# Patient Record
Sex: Male | Born: 1975 | Hispanic: No | Marital: Single | Smoking: Current some day smoker
Health system: Southern US, Community
[De-identification: ages and names within clinical notes are randomized; demographics above are authoritative.]

---

## 2014-01-18 ENCOUNTER — Encounter (HOSPITAL_COMMUNITY): Payer: Self-pay | Admitting: Emergency Medicine

## 2014-01-18 ENCOUNTER — Emergency Department (HOSPITAL_COMMUNITY): Payer: Self-pay

## 2014-01-18 ENCOUNTER — Emergency Department (HOSPITAL_COMMUNITY)
Admission: EM | Admit: 2014-01-18 | Discharge: 2014-01-19 | Disposition: A | Discharge status: Home / Self Care | Payer: Self-pay | Attending: Emergency Medicine | Admitting: Emergency Medicine

## 2014-01-18 DIAGNOSIS — T1490XA Injury, unspecified, initial encounter: Secondary | ICD-10-CM

## 2014-01-18 DIAGNOSIS — Z23 Encounter for immunization: Secondary | ICD-10-CM | POA: Insufficient documentation

## 2014-01-18 DIAGNOSIS — S1192XA Laceration with foreign body of unspecified part of neck, initial encounter: Secondary | ICD-10-CM | POA: Insufficient documentation

## 2014-01-18 DIAGNOSIS — T07XXXA Unspecified multiple injuries, initial encounter: Secondary | ICD-10-CM

## 2014-01-18 DIAGNOSIS — S51811A Laceration without foreign body of right forearm, initial encounter: Secondary | ICD-10-CM | POA: Insufficient documentation

## 2014-01-18 DIAGNOSIS — S41011A Laceration without foreign body of right shoulder, initial encounter: Secondary | ICD-10-CM | POA: Insufficient documentation

## 2014-01-18 LAB — CBC
HCT: 46.1 % (ref 39.0–52.0)
HEMOGLOBIN: 16.1 g/dL (ref 13.0–17.0)
MCH: 32.7 pg (ref 26.0–34.0)
MCHC: 34.9 g/dL (ref 30.0–36.0)
MCV: 93.7 fL (ref 78.0–100.0)
Platelets: 320 10*3/uL (ref 150–400)
RBC: 4.92 MIL/uL (ref 4.22–5.81)
RDW: 12.2 % (ref 11.5–15.5)
WBC: 6.1 10*3/uL (ref 4.0–10.5)

## 2014-01-18 LAB — CDS SEROLOGY

## 2014-01-18 MED ORDER — FENTANYL CITRATE 0.05 MG/ML IJ SOLN
INTRAMUSCULAR | Status: AC
  Filled 2014-01-18: qty 2

## 2014-01-18 MED ORDER — FENTANYL CITRATE 0.05 MG/ML IJ SOLN
50.0000 ug | Freq: Once | INTRAMUSCULAR | Status: AC
  Administered 2014-01-18: 50 ug via INTRAVENOUS

## 2014-01-18 MED ORDER — TETANUS-DIPHTH-ACELL PERTUSSIS 5-2.5-18.5 LF-MCG/0.5 IM SUSP
INTRAMUSCULAR | Status: AC
  Filled 2014-01-18: qty 0.5

## 2014-01-18 MED ORDER — TETANUS-DIPHTH-ACELL PERTUSSIS 5-2.5-18.5 LF-MCG/0.5 IM SUSP
0.5000 mL | Freq: Once | INTRAMUSCULAR | Status: AC
  Administered 2014-01-18: 0.5 mL via INTRAMUSCULAR
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
## 2014-01-19 LAB — PROTIME-INR
INR: 0.97 (ref 0.00–1.49)
Prothrombin Time: 13 seconds (ref 11.6–15.2)

## 2014-01-19 LAB — PREPARE FRESH FROZEN PLASMA
Unit division: 0
Unit division: 0

## 2014-01-19 LAB — COMPREHENSIVE METABOLIC PANEL
ALT: 106 U/L — AB (ref 0–53)
AST: 86 U/L — AB (ref 0–37)
Albumin: 4.6 g/dL (ref 3.5–5.2)
Alkaline Phosphatase: 62 U/L (ref 39–117)
Anion gap: 16 — ABNORMAL HIGH (ref 5–15)
BUN: 12 mg/dL (ref 6–23)
CALCIUM: 9 mg/dL (ref 8.4–10.5)
CO2: 23 meq/L (ref 19–32)
Chloride: 102 mEq/L (ref 96–112)
Creatinine, Ser: 0.72 mg/dL (ref 0.50–1.35)
Glucose, Bld: 111 mg/dL — ABNORMAL HIGH (ref 70–99)
POTASSIUM: 4 meq/L (ref 3.7–5.3)
SODIUM: 141 meq/L (ref 137–147)
Total Bilirubin: 0.2 mg/dL — ABNORMAL LOW (ref 0.3–1.2)
Total Protein: 8.1 g/dL (ref 6.0–8.3)

## 2014-01-19 LAB — SAMPLE TO BLOOD BANK

## 2014-01-19 LAB — ETHANOL: Alcohol, Ethyl (B): 251 mg/dL — ABNORMAL HIGH (ref 0–11)

## 2014-01-19 MED ORDER — LIDOCAINE-EPINEPHRINE 1 %-1:100000 IJ SOLN: 20.0000 mL | Freq: Once | INTRAMUSCULAR | Status: DC

## 2014-01-19 NOTE — Discharge Instructions (Signed)
Cuidados de una laceración - Adultos  °(Laceration Care, Adult) ° Una herida cortante es un corte o lesión que atraviesa todas las capas de la piel y el tejido que se encuentra debajo de la piel.  °TRATAMIENTO  °Algunas laceraciones no requieren sutura. Algunas no deben cerrarse debido a que puede aumentar el riesgo de infección. Es importante que consulte al médico lo antes posible después de recibir una lesión para minimizar el riesgo de infección y aumentar la posibilidad de que se cierre con éxito.  °Cuando se cierra adecuadamente, podrán indicarle analgésicos, si los necesita. La herida debe limpiarse para combatir la infección. El médico usará puntos (suturas), grapas,adhesivo, o tiras adhesivas para reparar la laceración. Estos elementos mantendrán unidos los bordes de la piel para que se cure más rápidamente y para un mejor resultado cosmético. Sin embargo, todas las heridas se curarán con una cicatriz. Una vez que la herida se haya curado, las cicatrices pueden minimizarse cubriendo la herida con pantalla solar durante el día por un lapso se 1 año.  °INSTRUCCIONES PARA EL CUIDADO EN EL HOGAR  °Si tiene puntos o grapas:  °· Mantenga la herida limpia y seca. °· Si tiene un (vendaje) cámbielo al menos una vez al día. Cámbielo si se moja o se ensucia, o según las indicaciones del médico. °· Lave el corte dos veces por día con agua y jabón. Enjuáguelo con agua para quitar todo el jabón. Seque dando palmaditas con una toalla limpia y seca. °· Después de limpiar, aplique una delgada capa de una crema con antibiótico según las indicaciones del médico. Esto le ayudará a prevenir las infecciones y a evitar que el vendaje se adhiera. °· Puede ducharse después de las primeras 24 horas. No remoje la herida en agua hasta que le hayan quitado los puntos. °· Solo tome medicamentos que se pueden comprar sin receta o recetados para el dolor, malestar o fiebre, como le indica el médico. °· Concurra para que le retiren los  puntos o las grapas cuando el médico le indique. °En caso que tenga tiras adhesivas:  °· Mantenga la herida limpia y seca. °· No deje que las tiras se mojen. Puede darse un baño cuidando de mantener la herida seca. °· Si se moja, séquela dando palmaditas con una toalla limpia. °· Las tiras caerán por sí mismas. Puede recortar las tiras a medida que la herida se cura. No quite las tiras que están pegadas a la herida. Ellas se caerán cuando sea el momento. °En caso que le hayan aplicado adhesivo.  °· Podrá mojara momentáneamente la herida en la ducha o el baño. No frote ni sumerja la herida. No practique natación. Evite transpirar con abundancia hasta que el adhesivo se haya caído. Después de ducharse o darse un baño, seque el corte dando palmaditas con una toalla limpia. °· No aplique medicamentos líquidos, en crema o ungüentos mientras el adhesivo esté en su lugar. Podrá aflojarlo antes de que la herida se cure. °· Si tiene un vendaje, tenga cuidado de no aplicar cinta adhesiva directamente sobre el adhesivo. Esto puede hacer que el adhesivo se caiga antes de que la herida se haya curado. °· Evite la exposición prolongada a la luz del sol o a la lámpara solar mientras en adhesivo se encuentre en el lugar. La exposición a los rayos ultravioletas durante el primer año oscurecerá la cicatriz. °· El adhesivo permanecerá sobre la piel durante 5 a 10 días y luego caerá naturalmente. No quite la película de adhesivo. °Deberá aplicarse   la vacuna contra el ttanos si:  No recuerda cundo se coloc la vacuna la ltima vez.  Nunca recibi esta vacuna. Si le han aplicado la vacuna contra el ttanos, el brazo podr hincharse, enrojecer y sentirse caliente al tacto. Esto es frecuente y no es un problema. Si usted necesita aplicarse la vacuna y se niega a recibirla, corre riesgo de contraer ttanos. sta es una enfermedad grave.  SOLICITE ATENCIN MDICA SI:   Presenta enrojecimiento, hinchazn o aumento del dolor en la  herida.  Hay rayas rojas que salen de la herida.  Observa un lquido blanco amarillento (pus) en la herida.  Tiene fiebre.  Advierte un olor ftido que proviene de la herida o del vendaje.  La herida se abre luego de que le han extrado las suturas.  Nota que en la herida hay algn cuerpo extrao como un trozo de [place] o vidrio.  La herida est en su mano o pie y observa que no puede mover correctamente los dedos. SOLICITE ATENCIN MDICA DE INMEDIATO SI:   El dolor no se alivia con los United Parcelmedicamentos.  Hay una zona muy hinchada alrededor de la herida que le causa dolor y adormecimiento, o advierte un cambio en el color en el brazo, la mano, la pierna o el pie.  La herida se abre y sangra nuevamente.  Siente que el adormecimiento, la debilidad o la prdida de la funcin de la articulacin que rodea la herida [place]or[place].  Palpa ndulos dolorosos cerca de la herida o bajo la piel en cualquier zona del cuerpo. ASEGRESE DE QUE:   Comprende estas instrucciones.  Controlar su enfermedad.  Solicitar ayuda de inmediato si no mejora o si empeora. Document Released: 03/08/2005 Document Revised: 05/31/2011 [facility] Patient Information 2015 [place], [place]. This information is not intended to replace advice given to you by your health care provider. Make sure you discuss any questions you have with your health care provider.    Herida abierta en la cabeza (Open Wound, Head) Una herida abierta es una ruptura de la piel debido a una lesin. Puede ser Neomia Dearuna raspadura, un corte, o una lesin penetrante. Un buen cuidado de la herida lo ayudar a:   Glass blower/designereducir el dolor.  Prevenir infecciones.  Reducir la cicatrizacin visible. CUIDADOS EN EL HOGAR  Lave la suciedad de la herida.  Lave la herida a diario con jabn suave y [place].  Lave su cabello de la forma habitual.  Aplique crema medicada luego de lavar la herida, segn las indicaciones del mdico.  Coloque un vendaje limpio a  diario si fuera necesario. SOLICITE AYUDA DE INMEDIATO SI:   Observa un aumento del enrojecimiento o la hinchazn (inflamacin) en la herida o a su alrededor.  El dolor [place]umenta.  Usted o su nio tienen una temperatura oral de ms de 102 F (38.9 C) y no puede controlarla con medicamentos.  Su beb tiene ms de 3 meses y su temperatura rectal es de 102 F (38.9 C) o mayor.  Su beb tiene 3 meses o menos y su temperatura rectal es de 100.4 F (38 C) o mayor.  Aparece lquido de color blanco amarillento (pus) que proviene de la herida.  No consigue Engineer, materialsaliviar el dolor con analgsicos.  Aparecen vetas rojizas en la piel que se extienden por encima o por debajo de la herida. ASEGRESE DE QUE:   Comprende estas instrucciones.  Controlar su enfermedad.  Solicitar ayuda de inmediato si no mejora o si empeora. Document Released: 04/10/2010 Document Revised: 05/31/2011 ExitCare Patient  Information 2015 [place], [place]. This information is not intended to replace advice given to you by your health care provider. Make sure you discuss any questions you have with your health care provider.

## 2014-01-23 ENCOUNTER — Encounter (HOSPITAL_COMMUNITY): Payer: Self-pay | Admitting: *Deleted

## 2014-01-23 ENCOUNTER — Emergency Department (HOSPITAL_COMMUNITY)
Admission: EM | Admit: 2014-01-23 | Discharge: 2014-01-23 | Disposition: A | Discharge status: Home / Self Care | Payer: Self-pay | Attending: Emergency Medicine | Admitting: Emergency Medicine

## 2014-01-23 DIAGNOSIS — Z4801 Encounter for change or removal of surgical wound dressing: Secondary | ICD-10-CM | POA: Insufficient documentation

## 2014-01-23 DIAGNOSIS — Z720 Tobacco use: Secondary | ICD-10-CM | POA: Insufficient documentation

## 2014-01-23 DIAGNOSIS — IMO0002 Reserved for concepts with insufficient information to code with codable children: Secondary | ICD-10-CM

## 2014-01-23 MED ORDER — CLINDAMYCIN HCL 150 MG PO CAPS: 450.0000 mg | ORAL_CAPSULE | Freq: Four times a day (QID) | ORAL | Status: AC

## 2014-01-23 MED ORDER — IBUPROFEN 400 MG PO TABS
800.0000 mg | ORAL_TABLET | Freq: Once | ORAL | Status: AC
  Administered 2014-01-23: 800 mg via ORAL
  Filled 2014-01-23: qty 2
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
## 2014-01-23 NOTE — ED Notes (Signed)
pts vital signs updated pt awaiting discharge paperwork at bedside.  
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
## 2014-01-23 NOTE — Discharge Instructions (Signed)
Please follow the directions provided. You may take ibuprofen every 6 hours for pain or Tylenol every 4 hours for pain. Please take the antibiotic until it is all gone for the redness and infection on your head. Don't hesitate to return for any new, worsening, or concerning symptoms.  SEEK IMMEDIATE MEDICAL CARE IF:  You have redness, swelling, or increasing pain in the wound.  You notice pus coming from the wound.  You have a fever.  You notice a bad smell coming from the wound or dressing.
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
